# Patient Record
Sex: Female | Born: 1955 | Hispanic: No | Marital: Married | Smoking: Current every day smoker
Health system: Southern US, Community
[De-identification: ages and names within clinical notes are randomized; demographics above are authoritative.]

## PROBLEM LIST (undated history)

## (undated) DIAGNOSIS — M1990 Unspecified osteoarthritis, unspecified site: Secondary | ICD-10-CM

## (undated) DIAGNOSIS — K219 Gastro-esophageal reflux disease without esophagitis: Secondary | ICD-10-CM

## (undated) HISTORY — PX: TUBAL LIGATION: SHX77

## (undated) HISTORY — PX: ABDOMINAL HYSTERECTOMY: SHX81

## (undated) HISTORY — PX: PARATHYROIDECTOMY: SHX19

## (undated) HISTORY — PX: CHOLECYSTECTOMY: SHX55

---

## 2010-02-25 ENCOUNTER — Ambulatory Visit: Payer: Self-pay | Admitting: Gastroenterology

## 2010-02-25 ENCOUNTER — Encounter (INDEPENDENT_AMBULATORY_CARE_PROVIDER_SITE_OTHER): Payer: Self-pay | Admitting: *Deleted

## 2010-02-25 DIAGNOSIS — J4489 Other specified chronic obstructive pulmonary disease: Secondary | ICD-10-CM | POA: Insufficient documentation

## 2010-02-25 DIAGNOSIS — R1013 Epigastric pain: Secondary | ICD-10-CM | POA: Insufficient documentation

## 2010-02-25 DIAGNOSIS — K219 Gastro-esophageal reflux disease without esophagitis: Secondary | ICD-10-CM | POA: Insufficient documentation

## 2010-02-25 DIAGNOSIS — J449 Chronic obstructive pulmonary disease, unspecified: Secondary | ICD-10-CM

## 2010-02-25 DIAGNOSIS — D509 Iron deficiency anemia, unspecified: Secondary | ICD-10-CM | POA: Insufficient documentation

## 2010-02-25 DIAGNOSIS — K625 Hemorrhage of anus and rectum: Secondary | ICD-10-CM

## 2010-02-25 LAB — CONVERTED CEMR LAB
AST: 19 units/L (ref 0–37)
Alkaline Phosphatase: 69 units/L (ref 39–117)
Basophils Absolute: 0.1 10*3/uL (ref 0.0–0.1)
Basophils Relative: 0.8 % (ref 0.0–3.0)
Bilirubin, Direct: 0.1 mg/dL (ref 0.0–0.3)
Calcium: 10.3 mg/dL (ref 8.4–10.5)
Chloride: 110 meq/L (ref 96–112)
Eosinophils Absolute: 0 10*3/uL (ref 0.0–0.7)
Glucose, Bld: 87 mg/dL (ref 70–99)
Iron: 72 ug/dL (ref 42–145)
Lymphocytes Relative: 21.5 % (ref 12.0–46.0)
MCHC: 33.3 g/dL (ref 30.0–36.0)
MCV: 86.8 fL (ref 78.0–100.0)
Monocytes Absolute: 0.4 10*3/uL (ref 0.1–1.0)
Monocytes Relative: 4.5 % (ref 3.0–12.0)
Neutro Abs: 6.7 10*3/uL (ref 1.4–7.7)
Platelets: 200 10*3/uL (ref 150.0–400.0)
Potassium: 4.7 meq/L (ref 3.5–5.1)
TSH: 1.15 microintl units/mL (ref 0.35–5.50)
Total Bilirubin: 0.5 mg/dL (ref 0.3–1.2)
Total Protein: 6.3 g/dL (ref 6.0–8.3)
Vitamin B-12: 321 pg/mL (ref 211–911)

## 2010-03-03 LAB — CONVERTED CEMR LAB
Tissue Transglutaminase Ab, IgA: 4.7 units (ref <20)
Vit D, 25-Hydroxy: 15 ng/mL — ABNORMAL LOW (ref 30–89)

## 2010-03-14 ENCOUNTER — Ambulatory Visit: Payer: Self-pay | Admitting: Gastroenterology

## 2010-03-14 LAB — CONVERTED CEMR LAB: UREASE: NEGATIVE

## 2010-03-18 ENCOUNTER — Encounter: Payer: Self-pay | Admitting: Gastroenterology

## 2010-03-28 ENCOUNTER — Ambulatory Visit: Payer: Self-pay | Admitting: Gastroenterology

## 2010-03-28 DIAGNOSIS — K5900 Constipation, unspecified: Secondary | ICD-10-CM | POA: Insufficient documentation

## 2010-04-14 ENCOUNTER — Telehealth: Payer: Self-pay | Admitting: Gastroenterology

## 2010-04-25 ENCOUNTER — Ambulatory Visit: Payer: Self-pay | Admitting: Gastroenterology

## 2010-04-29 ENCOUNTER — Ambulatory Visit (HOSPITAL_COMMUNITY): Admission: RE | Admit: 2010-04-29 | Discharge: 2010-04-29 | Payer: Self-pay | Admitting: Gastroenterology

## 2010-05-30 ENCOUNTER — Encounter: Payer: Self-pay | Admitting: Gastroenterology

## 2010-06-12 ENCOUNTER — Telehealth: Payer: Self-pay | Admitting: Gastroenterology

## 2010-06-18 ENCOUNTER — Telehealth: Payer: Self-pay | Admitting: Gastroenterology

## 2010-06-23 ENCOUNTER — Telehealth: Payer: Self-pay | Admitting: Gastroenterology

## 2010-11-25 NOTE — Procedures (Signed)
Summary: Colonoscopy  Patient: Lori Cabrera Note: All result statuses are Final unless otherwise noted.  Tests: (1) Colonoscopy (COL)   COL Colonoscopy           DONE     [facility]     [address]     [place], [place]  [address]           COLONOSCOPY PROCEDURE REPORT           PATIENT:  [name], [name]  MR#:  [id number]     BIRTHDATE:  April 09, 1956, 53 yrs. old  GENDER:  female     ENDOSCOPIST:  Vania Rea. Jarold Motto, MD, [facility]     REF. BY:     PROCEDURE DATE:  03/14/2010     PROCEDURE:  Colonoscopy with biopsy     ASA CLASS:  Class II     INDICATIONS:  Routine Risk Screening, hematochezia     MEDICATIONS:   Fentanyl 50 mcg IV, Versed 7 mg IV           DESCRIPTION OF PROCEDURE:   After the risks benefits and     alternatives of the procedure were thoroughly explained, informed     consent was obtained.  Digital rectal exam was performed and     revealed no abnormalities.   The LB CF-H180AL [id number] endoscope     was introduced through the anus and advanced to the cecum, which     was identified by both the appendix and ileocecal valve, without     limitations.  The quality of the prep was excellent, using     MoviPrep.  The instrument was then slowly withdrawn as the colon     was fully examined.     <<PROCEDUREIMAGES>>           FINDINGS:  No polyps or cancers were seen.  This was otherwise a     normal examination of the colon. random biopsies done.     Retroflexed views in the rectum revealed internal hemorrhoids.     The scope was then withdrawn from the patient and the procedure     completed.           COMPLICATIONS:  None     ENDOSCOPIC IMPRESSION:     1) No polyps or cancers     2) Otherwise normal examination     3) Internal hemorrhoids     RECOMMENDATIONS:     1) high fiber diet     2) Upper endoscopy will be scheduled     daily miralax,,,,     REPEAT EXAM:  No           ______________________________     Vania Rea. Jarold Motto, MD, [facility]       CC:  Joette Catching, MD           n.     Rosalie Doctor:   Vania Rea. Leafy Motsinger at 03/14/2010 02:37 PM           [place], [name], [id number]  Note: An exclamation mark (!) indicates a result that was not dispersed into the flowsheet. Document Creation Date: 03/14/2010 2:38 PM _______________________________________________________________________  (1) Order result status: Final Collection or observation date-time: 03/14/2010 14:29 Requested date-time:  Receipt date-time:  Reported date-time:  Referring Physician:   Ordering Physician: Sheryn Bison [phone]) Specimen Source:  Source: Launa Grill Order Number: [id number] Lab site:

## 2010-11-25 NOTE — Letter (Signed)
Summary: Osmoprep Instructions  [facility]  [address] [place], [place] [address]   Phone: [phone]  Fax: [phone]       Lori Cabrera    Mar 19, 1956    MRN: [id number]        Procedure Day /Date: Friday, 03/14/10     Arrival Time: 1:00      Procedure Time: 2:00     Location of Procedure:                    [facility] (4th Floor)     PREPARATION FOR COLONOSCOPY WITH OSMOPREP  Starting 5 days prior to your procedure 03/09/10 do not eat nuts, seeds, popcorn, corn, beans, peas,  salads, or any raw vegetables.  Do not take any fiber supplements (e.g. Metamucil, Citrucel, and Benefiber). _________________________________________________________________________________________________  THE DAY BEFORE YOUR PROCEDURE             DATE: 03/13/10     DAY: Thursday  1.   Drink clear liquids the entire day - NO SOLID FOOD.  Drink at least 64 oz. of fluid during the day to prevent hydration and help the prep work efficiently.    2.   Do not drink anything colored red or purple.  Avoid juices with pulp.  No orange juice.              CLEAR LIQUIDS INCLUDE: Water Jello Ice Popsicles Tea (sugar ok, no milk/cream) Powdered fruit flavored drinks Coffee (sugar ok, no milk/cream) Gatorade Juice: apple, white grape, white cranberry  Lemonade Clear bullion, consomm, broth Carbonated beverages (any kind) Strained chicken noodle soup Hard Candy   3.   Beginning at 5:00 p.m. or 6:00 p.m. the night before your procedure, drink one dose (4 tablets with 8 oz. of any clear liquid) every 15 minutes for a total of 5 doses (20 tablets total).  ___________________________________________________________________________________________________   THE DAY OF YOUR PROCEDURE            DATE: 03/14/10   DAY: Friday  1.   Beginning at 9:00  (5 hours before procedure), drink one dose (4 tablets with 8 oz. of any clear liquid) every 15 minutes for a total of  3 doses (12 tablets).  2.   You may drink clear liquids until 12:00 (2 hours before exam).  Do not drink anything after this time.       MEDICATION INSTRUCTIONS  Unless otherwise instructed, you should take regular prescription medications with a small sip of water as early as possible the morning of your procedure.                   OTHER INSTRUCTIONS  You will need a responsible adult at least 54 years of age to accompany you and drive you home.   This person must remain in the waiting room during your procedure.  Wear loose fitting clothing that is easily removed.  Leave jewelry and other valuables at home.  However, you may wish to bring a book to read or an iPod/MP3 player to listen to music as you wait for your procedure to start.  Remove all body piercing jewelry and leave at home.  Total time from sign-in until discharge is approximately 2-3 hours.  You should go home directly after your procedure and rest.  You can resume normal activities the day after your procedure.  The day of your procedure you should not:   Drive  Make legal decisions   Operate machinery   Drink alcohol   Return to work  You will receive specific instructions about eating, activities and medications before you leave.   The above instructions have been reviewed and explained to me by   _______________________   I fully understand and can verbalize these instructions _____________________________ Date _________

## 2010-11-25 NOTE — Letter (Signed)
Summary: OSP Based Product Consent Form  [facility]  [address] [place], [place] [address]   Phone: [phone]  Fax: [phone]      Feb 25, 2010 MRN: [id number] Lori Cabrera  [facility]  OSP BASED PRODUCT CONSENT FORM  I understand that in order to prepare myself for an examination of my large bowel during a colonoscpoy I must undergo a bowel cleansing prior to the procedure. This is to ensure that my bowel is as clean as possible so the physician performing the procedure can thoroughly examine the lining of my colon to detect polyps or other abnormalities.  I have beeen informed that the FDA has become aware of reports of acute phosphate nephropathy, a type of acute kidney injury, associated with the use of oral sodium phosphate based products (OPS) for bowel cleansing prior to colonoscopy and other procedures. These rare but serious events have occurred in some patients wothout identifible factors that would put them at risk for developing acute kidney injury. These products include the prescription products, OsmoPrep and Visicol, as well as previously available over-the-counter laxatives (e.g., Fleet Phospho-soda).  Prior caution should be taken in patients with a history of renal insufficiency, advanced age, or those taking diuretics or certain blood pressure medications. The use of OSP is contraindicated in patients with advanced renal disease or a history of heart failure.  Alternative preparations for bowel cleansing recommended by my physician include: MoviPrep, Golytely, HalfLytely, Miralax, or other PEG (polyethylene glycol) based products which, to date, have not been associated with these types od adverse events.  The above risks and alternatives have been fully expained to me and I am aware that by taking OsmoPrep, or like products, an acute kidney injury may occur without warning. Despite this warning, I have chosen to use a phosphate-based  regime for bowel cleansing prior to my colonoscopy and will assume responsibility for any adverse effect that I may experience from this product. I also understand the importance of vigorous hydration before and immediately following my procedure.     Instructed by:________________________________     Signed:________________________________________   Date:_______________   Original 12/27/07

## 2010-11-25 NOTE — Letter (Signed)
Summary: Patient [facility] Biopsy Results  [facility]  [address] [place], [place] [address]   Phone: [phone]  Fax: [phone]        Mar 18, 2010 MRN: [id number]    [facility] [address] [place], [place]  [address]    Dear Ms. Symons,  I am pleased to inform you that the biopsies taken during your recent endoscopic examination did not show any evidence of cancer upon pathologic examination.Biopsies showed no evidence of celiac disease, H. pylori infection, or dysplasia.  Additional information/recommendations:  __No further action is needed at this time.  Please follow-up with      your primary care physician for your other healthcare needs.  _x_ Please call [phone] to schedule a return visit to review      your condition.  _x_ Continue with the treatment plan as outlined on the day of your      exam.  __ You should have a repeat endoscopic examination for this problem              in _ months/years.   Please call us if you are having persistent problems or have questions about your condition that have not been fully answered at this time.  Sincerely,  Mardella Layman MD [facility]  This letter has been electronically signed by your physician.  Appended Document: Patient Notice-Endo Biopsy Results letter mailed.

## 2010-11-25 NOTE — Procedures (Signed)
Summary: Upper Endoscopy  Patient: Lori Cabrera Note: All result statuses are Final unless otherwise noted.  Tests: (1) Upper Endoscopy (EGD)   EGD Upper Endoscopy       DONE     [facility]     [address]     [place], [place]  [address]           ENDOSCOPY PROCEDURE REPORT           PATIENT:  [name], Lori  MR#:  [id number]     BIRTHDATE:  1956-04-25, 53 yrs. old  GENDER:  female           ENDOSCOPIST:  Vania Rea. Jarold Motto, MD, [facility]     Referred by:           PROCEDURE DATE:  03/14/2010     PROCEDURE:  EGD with biopsy     ASA CLASS:  Class II     INDICATIONS:  dyspepsia, abdominal pain despite treatment           MEDICATIONS:   There was residual sedation effect present from     prior procedure., Fentanyl 25 mcg IV, Versed 2 mg IV     TOPICAL ANESTHETIC:           DESCRIPTION OF PROCEDURE:   After the risks benefits and     alternatives of the procedure were thoroughly explained, informed     consent was obtained.  The LB GIF-H180 [id number] endoscope was     introduced through the mouth and advanced to the second portion of     the duodenum, without limitations.  The instrument was slowly     withdrawn as the mucosa was fully examined.     <<PROCEDUREIMAGES>>           Duodenitis was found. inflammatory polyps noted.  Severe gastritis     was found. nodularity and erythema biopsied in body and antrum     areas.SEE PICTURES.    Retroflexed views revealed no masses.     The scope was then withdrawn from the patient and the procedure     completed.           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     RECOMMENDATIONS:     1) Await biopsy results     2) Rx CLO if positive     3) continue current medications           REPEAT EXAM:  No           ______________________________     Vania Rea. Jarold Motto, MD, [facility]           CC:   Karie Schwalbe, MD           n.     Rosalie DoctorMarland Kitchen   Vania Rea. Patterson at 03/14/2010 02:50 PM           [place], [place], [id number]  Note: An exclamation mark (!) indicates a result that was not dispersed into the flowsheet. Document Creation Date: 03/14/2010 2:51 PM _______________________________________________________________________  (1) Order result status: Final Collection or observation date-time: 03/14/2010 14:45 Requested date-time:  Receipt date-time:  Reported date-time:  Referring Physician:   Ordering Physician: Sheryn Bison [phone]) Specimen Source:  Source: Launa Grill Order Number: [id number]  Lab site:

## 2010-11-25 NOTE — Letter (Signed)
Summary: Patient Notice- Colon Biospy Results  [facility]  [address] [place], [place] [address]   Phone: [phone]  Fax: [phone]        Mar 18, 2010 MRN: [id number]    [facility] [address] [place], [place]  [address]    Dear Ms. Fiola,  I am pleased to inform you that the biopsies taken during your recent colonoscopy did not show any evidence of cancer upon pathologic examination.  Additional information/recommendations:  __No further action is needed at this time.  Please follow-up with      your primary care physician for your other healthcare needs.  x__Please call [phone] to schedule a return visit to review      your condition.  _x_Continue with the treatment plan as outlined on the day of your      exam.  __You should have a repeat colonoscopy examination for this problem           in _ years.  Please call us if you are having persistent problems or have questions about your condition that have not been fully answered at this time.  Sincerely,  Mardella Layman MD [facility]   This letter has been electronically signed by your physician.  Appended Document: Patient Notice- Colon Biospy Results letter mailed.

## 2010-11-25 NOTE — Miscellaneous (Signed)
Summary: Clotest  Clinical Lists Changes  Orders: Added new Test order of TLB-H Pylori Screen Gastric Biopsy (83013-CLOTEST) - Signed 

## 2010-11-25 NOTE — Assessment & Plan Note (Signed)
Summary: Post Proc/dfs   History of Present Illness Visit Type: Follow-up Visit Primary GI MD: Sheryn Bison MD FACP FAGA Primary Provider: Joette Catching, MD Requesting Provider: na Chief Complaint: F/u for colon and endo. Pt states that she is doing better but because of iron she is having constipation problems  History of Present Illness:   Continued severe constipation and exacerbated by oral iron therapy. Associated with her constipation is gas, bloating, and vague abdominal discomfort mostly in the right upper quadrant area. She is status post cholecystectomy.  She recently underwent endoscopy and colonoscopy for evaluation of her constipation and rectal bleeding. Exam is unremarkable except for hemorrhoidal problems. She is taking MiraLax at nighttime without improvement. She was found to have iron and vitamin D deficiency and is on replacement therapy. Her constipation is most consistent with possible sigmoid colon dysfunction. She has had no nausea vomiting, systemic complaints, other neurologic or urologic symptomatology.   GI Review of Systems      Denies abdominal pain, acid reflux, belching, bloating, chest pain, dysphagia with liquids, dysphagia with solids, heartburn, loss of appetite, nausea, vomiting, vomiting blood, weight loss, and  weight gain.      Reports constipation.     Denies anal fissure, black tarry stools, change in bowel habit, diarrhea, diverticulosis, fecal incontinence, heme positive stool, hemorrhoids, irritable bowel syndrome, jaundice, light color stool, liver problems, rectal bleeding, and  rectal pain.    Current Medications (verified): 1)  Aciphex 20 Mg Tbec (Rabeprazole Sodium) .... One Tablet By Mouth Once Daily 2)  Integra Plus  Caps (Fefum-Fepoly-Fa-B Cmp-C-Biot) .... Take 1 Capsule By Mouth Two Times A Day 3)  Analpram-Hc 1-2.5 %  Crea (Hydrocortisone Ace-Pramoxine) .... Use 3-4 X Daily As Needed 4)  Miralax   Powd (Polyethylene Glycol 3350)  .... Two Times A Day 5)  Caltrate 600+d 600-400 Mg-Unit Tabs (Calcium Carbonate-Vitamin D) .... Two Times A Day  Allergies (verified): 1)  Codeine  Past History:  Past medical, surgical, family and social histories (including risk factors) reviewed for relevance to current acute and chronic problems.  Past Medical History: Reviewed history from 02/25/2010 and no changes required. Anemia gastritis Gallstones Colon Polyps  Past Surgical History: Reviewed history from 02/25/2010 and no changes required. Cholecystectomy Tubal Ligation  Family History: Reviewed history from 02/25/2010 and no changes required. Family History of Breast Cancer: Several maternal aunts, great aunts Family History of Diabetes: Mother, Paternal aunts, uncles Family History of Heart Disease: Father-CHF, MGF-MI No FH of Colon Cancer:  Social History: Reviewed history from 02/25/2010 and no changes required. Married, 3 boys, 1 girl Patient currently smokes.  Daily Caffeine Use 10/day Illicit Drug Use - no  Review of Systems  The patient denies allergy/sinus, anemia, anxiety-new, arthritis/joint pain, back pain, blood in urine, breast changes/lumps, change in vision, confusion, cough, coughing up blood, depression-new, fainting, fatigue, fever, headaches-new, hearing problems, heart murmur, heart rhythm changes, itching, menstrual pain, muscle pains/cramps, night sweats, nosebleeds, pregnancy symptoms, shortness of breath, skin rash, sleeping problems, sore throat, swelling of feet/legs, swollen lymph glands, thirst - excessive , urination - excessive , urination changes/pain, urine leakage, vision changes, and voice change.    Vital Signs:  Patient profile:   55 year old female Height:      65 inches Weight:      143 pounds BMI:     23.88 BSA:     1.72 Pulse rate:   76 / minute Pulse rhythm:   regular BP sitting:   108 /  60  (left arm) Cuff size:   regular  Vitals Entered By: Ok Anis CMA  (March 28, 2010 11:16 AM)  Physical Exam  General:  Well developed, well nourished, no acute distress.healthy appearing.  healthy appearing.   Head:  Normocephalic and atraumatic. Eyes:  PERRLA, no icterus. Abdomen:  No abdominal distention noted, organomegaly, masses or tenderness. Bowel sounds are somewhat hypoactive in nature. Psych:  Alert and cooperative. Normal mood and affect.   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 I have advanced her MiraLax 8 ounces twice a day, add Colace 100 mg twice a day, stopped iron, will try Amitiza 8 micrograms twice a day with liberal p.o. fluids, and also add [employer] father-probiotic tablets twice a day with office followup in 2-3 weeks' time. She may well need Sitz marker study exam.  Problem # 2:  ABDOMINAL PAIN, EPIGASTRIC (ICD-789.06) Assessment: Improved Continue AcipHex 20 mg a day.  Problem # 3:  HEMORRHAGE OF RECTUM AND ANUS (ICD-569.3) Assessment: Improved Hemorrhoidal bleeding has ceased. See colonoscopy report for details  Patient Instructions: 1)  Begin Amitiza 8 mg two times a day. 2)  Begin Colace two times a day.   3)  Begin Vear Clock Colon health two times a day.  Buy this OTC   4)  Increase Miralax to two times a day. 5)  Stop Iron. 6)  Please schedule a follow-up appointment in 3 weeks.  7)  The medication list was reviewed and reconciled.  All changed / newly prescribed medications were explained.  A complete medication list was provided to the patient / caregiver. 8)  Copy sent to : Dr. Joette Catching 9)  Please continue current medications.  10)  Constipation and Hemorrhoids brochure given.   Appended Document: Post Proc/dfs    Clinical Lists Changes  Medications: Added new medication of AMITIZA 8 MCG  CAPS (LUBIPROSTONE) 1 two times a day/take with food and water - Signed Added  new medication of COLACE 100 MG  CAPS (DOCUSATE SODIUM) 1 by mouth two times a day - Signed Added new medication of PHILLIPS COLON HEALTH  CAPS (PROBIOTIC PRODUCT) two times a day Rx of AMITIZA 8 MCG  CAPS (LUBIPROSTONE) 1 two times a day/take with food and water;  #60 x 11;  Signed;  Entered by: Ashok Cordia RN;  Authorized by: Mardella Layman MD [facility];  Method used: [facility] [phone]*, [address], [place], [place], [place]  [address], Ph: [phone] or [phone], Fax: [phone] Rx of COLACE 100 MG  CAPS (DOCUSATE SODIUM) 1 by mouth two times a day;  #60 x 11;  Signed;  Entered by: Ashok Cordia RN;  Authorized by: Mardella Layman MD [facility];  Method used: [facility] to [facility] [phone]*, [address] [place], [place], [place]  [address], Ph: [phone] or [phone], Fax: [phone]    Prescriptions: COLACE 100 MG  CAPS (DOCUSATE SODIUM) 1 by mouth two times a day  #60 x 11   Entered by:   Ashok Cordia RN   Authorized by:   Mardella Layman MD [facility]   Signed by:   Ashok Cordia RN on 03/28/2010   Method used:   [facility]ronically to        [facility] [phone]* (retail)       [address]  [place], [place]  [address]       Ph: [phone] or [phone]       Fax: [phone]   RxID:   [id number] AMITIZA 8 MCG  CAPS (LUBIPROSTONE) 1 two times a day/take with food and water  #60 x 11   Entered by:   Ashok Cordia RN   Authorized by:   Mardella Layman MD [facility]   Signed by:   Ashok Cordia RN on 03/28/2010   Method used:   Electronically to        [facility] [phone]* (retail)       [address]       [place], [place]  [address]       Ph: [phone] or [phone]       Fax: [phone]   RxID:   [id number]

## 2010-11-25 NOTE — Progress Notes (Signed)
Summary: results request  Phone Note Call from Patient Call back at Home Phone [phone]   Caller: Patient Call For: Dr. Jarold Motto Reason for Call: Lab or Test Results Summary of Call: would like manometry results Initial call taken by: Vallarie Mare,  June 23, 2010 12:50 PM  Follow-up for Phone Call        PEr Dr. Jarold Motto.  Pt has weak anal sphincter preessure.  Recommendation from [facility] is Biofeedback for pelvic floor retraining. Follow-up by: Ashok Cordia RN,  June 23, 2010 4:42 PM  Additional Follow-up for Phone Call Additional follow up Details #1::        Pt notified.  States she is not interested in biofeedback at this time.  She will call back if she changes her mind. Additional Follow-up by: Ashok Cordia RN,  June 25, 2010 10:05 AM

## 2010-11-25 NOTE — Progress Notes (Signed)
Summary: constipation  Phone Note Call from Patient Call back at [facility] Phone [phone]   Caller: Patient Call For: Dr. Jarold Motto Reason for Call: Talk to Nurse Summary of Call: still constipated and meds not helping... crying from pain when trying to have a BM and blood still coming out when trying to have a BM Initial call taken by: Vallarie Mare,  April 14, 2010 4:50 PM  Follow-up for Phone Call        See last OV note.  Pt states she is using the amitiza, colace and miralax. States she is onlu passing very small amt of stool.  Passing bright red blood in stool.  C/O cramping abd pain.  Asking what else can she do? Follow-up by: Ashok Cordia RN,  April 14, 2010 4:58 PM    Additional Follow-up for Phone Call Additional follow up Details #2::    dulcolax tabs ..2 .two times a day....analmantle plus kit...continue other meds..Start over the [employer], which is effective in treating constipation and can be taken chronically.  Start with one scoop a day, titrating up or down as needed. two times a day....also daily benefiber... Follow-up by: Mardella Layman MD FACG,  April 15, 2010 8:28 AM  Additional Follow-up for Phone Call Additional follow up Details #3:: Details for Additional Follow-up Action Taken: Pt notified.   Additional Follow-up by: Ashok Cordia RN,  April 15, 2010 8:55 AM  New/Updated Medications: ANAMANTLE HC 3-0.5 %  KIT (LIDOCAINE-HYDROCORTISONE ACE) Use two times a day. Prescriptions: ANAMANTLE HC 3-0.5 %  KIT (LIDOCAINE-HYDROCORTISONE ACE) Use two times a day.  #30 x 3   Entered by:   Ashok Cordia RN   Authorized by:   Mardella Layman MD [facility]   Signed by:   Ashok Cordia RN on 04/15/2010   Method used:   [facility]        [facility] [phone]* (retail)       [address]       [place], [place]  [address]       Ph: [phone] or [phone]       Fax: [phone]   RxID:   [id number]

## 2010-11-25 NOTE — Assessment & Plan Note (Signed)
Summary: RECTAL BLEEDING...AS.   History of Present Illness Visit Type: consult Primary GI MD: Sheryn Bison MD FACP FAGA Requesting Provider: Joette Catching, MD Chief Complaint: rectal bleeding x 2 years, pt was seen at [facility] 2 years ago.  Blood is in toilet History of Present Illness:   55 year old Caucasian female heavy smoker referred by Dr. Lysbeth Galas for a 45 year history of intermittent rectal bleeding with worsening constipation. Apparently she was hospitalized in [facility] 3 years ago with rectal bleeding, and had a negative colonoscopy and endoscopy. These records are not available for review.  She continues with chronic dyspepsia and a" weak stomach" with intolerance to fried greasy foods. She drinks volumes of Pepsi Cola daily but apparently otherwise no fluids, and very little fiber. She may go up to 2 weeks allegedly without a bowel movement .She has diarrhea with laxative use. Her rectal bleeding is bright red blood and her hemoglobin recently was 11.5 with a 8% saturation. She denies a history of osteoporosis, mental status problems, recurrent skin rashes. She denies lactose intolerance or ingestion of sorbitol or fructose. She had cholecystectomy some 25 years ago for cholelithiasis. She recently was placed on AcipHex 20 mg a day with mild improvement in her dyspepsia, also oral iron therapy. She has reflux symptoms but denies dysphagia.  Her past sutures consistent with NSAID abuse, and she apparently avoid salicylates and NSAIDs currently. She continues to smoke one half packs of cigarettes per day denies ethanol abuse. She denies a past history of hepatic disease, pancreatitis, or alcohol related illnesses. Family history is noncontributory.   GI Review of Systems    Reports acid reflux and  heartburn.      Denies abdominal pain, belching, bloating, chest pain, dysphagia with liquids, dysphagia with solids, loss of appetite, nausea, vomiting, vomiting blood,  weight loss, and  weight gain.      Reports black tarry stools, constipation, and  rectal bleeding.     Denies anal fissure, change in bowel habit, diarrhea, diverticulosis, fecal incontinence, heme positive stool, hemorrhoids, irritable bowel syndrome, jaundice, light color stool, liver problems, and  rectal pain. Preventive Screening-Counseling & Management      Drug Use:  no.      Current Medications (verified): 1)  Aciphex 20 Mg Tbec (Rabeprazole Sodium) .Marland Kitchen.. 1 Qd 2)  Integra Plus  Caps (Fefum-Fepoly-Fa-B Cmp-C-Biot) .... Take 1 Capsule By Mouth Two Times A Day  Allergies (verified): 1)  Codeine  Past History:  Past medical, surgical, family and social histories (including risk factors) reviewed for relevance to current acute and chronic problems.  Past Medical History: Anemia gastritis Gallstones Colon Polyps  Past Surgical History: Cholecystectomy Tubal Ligation  Family History: Reviewed history and no changes required. Family History of Breast Cancer: Several maternal aunts, great aunts Family History of Diabetes: Mother, Paternal aunts, uncles Family History of Heart Disease: Father-CHF, MGF-MI  Social History: Reviewed history from 02/24/2010 and no changes required. Married, 3 boys, 1 girl Patient currently smokes.  Daily Caffeine Use 10/day Illicit Drug Use - no Drug Use:  no  Review of Systems       The patient complains of allergy/sinus, anxiety-new, cough, menstrual pain, night sweats, thirst - excessive, and vision changes.  The patient denies anemia, arthritis/joint pain, back pain, blood in urine, breast changes/lumps, confusion, coughing up blood, depression-new, fainting, fatigue, fever, headaches-new, hearing problems, heart murmur, heart rhythm changes, itching, muscle pains/cramps, nosebleeds, pregnancy symptoms, shortness of breath, skin rash, sleeping problems, sore throat,  swelling of feet/legs, swollen lymph glands, urination - excessive,  urination changes/pain, urine leakage, and voice change.   General:  Complains of sweats; denies fever, chills, anorexia, fatigue, weakness, malaise, weight loss, and sleep disorder. Eyes:  Complains of vision loss; denies blurring, diplopia, irritation, discharge, scotoma, eye pain, and photophobia. ENT:  Denies earache, ear discharge, tinnitus, decreased hearing, nasal congestion, loss of smell, nosebleeds, sore throat, hoarseness, and difficulty swallowing. CV:  Complains of dyspnea on exertion; denies chest pains, angina, palpitations, syncope, orthopnea, PND, peripheral edema, and claudication. Resp:  Complains of dyspnea with exercise and cough; denies dyspnea at rest, sputum, wheezing, coughing up blood, and pleurisy. GI:  Complains of indigestion/heartburn, abdominal pain, constipation, and bloody BM's; denies difficulty swallowing, pain on swallowing, nausea, vomiting, vomiting blood, jaundice, gas/bloating, diarrhea, change in bowel habits, black BMs, and fecal incontinence. GU:  Denies urinary burning, blood in urine, nocturnal urination, urinary frequency, urinary incontinence, abnormal vaginal bleeding, amenorrhea, menorrhagia, vaginal discharge, pelvic pain, genital sores, painful intercourse, and decreased libido. MS:  Denies joint pain / LOM, joint swelling, joint stiffness, joint deformity, low back pain, muscle weakness, muscle cramps, muscle atrophy, leg pain at night, leg pain with exertion, and shoulder pain / LOM hand / wrist pain (CTS). Derm:  Denies rash, itching, dry skin, hives, moles, warts, and unhealing ulcers. Neuro:  Complains of restless legs; denies weakness, paralysis, abnormal sensation, seizures, syncope, tremors, vertigo, transient blindness, frequent falls, frequent headaches, difficulty walking, headache, sciatica, radiculopathy other:, memory loss, and confusion. Endo:  Complains of polydipsia; denies cold intolerance, heat intolerance, polyphagia, polyuria, unusual  weight change, and hirsutism. Heme:  Denies bruising, bleeding, enlarged lymph nodes, and pagophagia. Allergy:  Complains of hay fever.  Vital Signs:  Patient profile:   55 year old female Height:      65 inches Weight:      147 pounds BMI:     24.55 Pulse rate:   64 / minute Pulse rhythm:   regular BP sitting:   120 / 70  (left arm) Cuff size:   regular  Vitals Entered By: Francee Piccolo CMA Duncan Dull) (Feb 25, 2010 8:39 AM)  Physical Exam  General:  Well developed, well nourished, no acute distress.Appearance consistent with COPD but no jaundice or stigmata of chronic liver disease. Head:  Normocephalic and atraumatic. Eyes:  PERRLA, no icterus.exam deferred to patient's ophthalmologist.   Neck:  Supple; no masses or thyromegaly. Lungs:  decreased BS on L, decreased BS on R, and rhonchi bilateral.   Heart:  Regular rate and rhythm; no murmurs, rubs,  or bruits. Abdomen:  Her Abdomen Is not distended or tender. Her liver edge is very firm in 2-3 cm below the right subcostal margin. I cannot appreciate any tenderness. There is no splenomegaly, other abdominal masses or tenderness.Bowel sounds are normal and I cannot appreciate an abdominal bruit. Rectal:  External hemorrhoids and skin tags noted without rectal masses or tenderness. I cannot appreciate any fissure or fistula. Her uterus is retroflexed and is palpable. There is soft stool present of normal color which is guaiac negative. Msk:  Symmetrical with no gross deformities. Normal posture. Pulses:  Normal pulses noted. Extremities:  No clubbing, cyanosis, edema or deformities noted. Neurologic:  Alert and  oriented x4;  grossly normal neurologically. Skin:  Intact without significant lesions or rashes. Cervical Nodes:  No significant cervical adenopathy. Psych:  Alert and cooperative. Normal mood and affect.   Impression & Recommendations:  Problem # 1:  ABDOMINAL PAIN, EPIGASTRIC (  ICD-789.06) Assessment  Improved Probable chronic gastroesophageal reflux disease currently doing much better on daily PPI therapy--rule out H. pylori infection. She is status post cholecystectomy. Abdominal exam does show mild hepatomegaly, liver function tests have been normal. She denies a previous history of alcohol abuse, IV drug use, previous hepatitis, or family history of liver disease. Screening labs have been ordered. She may need followup upper abdominal ultrasound exam. Endoscopy has been scheduled and we will continue PPI therapy. Also serologies have been ordered to exclude celiac disease. Orders: TLB-CBC Platelet - w/Differential (85025-CBCD) TLB-BMP (Basic Metabolic Panel-BMET) (80048-METABOL) TLB-Hepatic/Liver Function Pnl (80076-HEPATIC) TLB-TSH (Thyroid Stimulating Hormone) (84443-TSH) TLB-B12, Serum-Total ONLY (16109-U04) TLB-Ferritin (82728-FER) TLB-Folic Acid (Folate) (82746-FOL) TLB-IBC Pnl (Iron/FE;Transferrin) (83550-IBC) TLB-Magnesium (Mg) (83735-MG) TLB-Sedimentation Rate (ESR) (85652-ESR) TLB-IgA (Immunoglobulin A) (82784-IGA) T-Sprue Panel (Celiac Disease Aby Eval) (83516x3/[address]) T-Vitamin D (25-Hydroxy) (54098-11914) T- * Misc. Laboratory test [phone])  Problem # 2:  HEMORRHAGE OF RECTUM AND ANUS (ICD-569.3) Assessment: Unchanged Severe chronic constipation with hemorrhoidal bleeding-rule out metabolic causes of constipation, partial colonic obstruction, et Karie Soda. I placed her on nightly MiraLax 8 ounces as tolerated, liberal p.o. fluids, and outpatient colonoscopy exam with Osmolite prep at her request. She apparently could not tolerate an oral electrolyte preparation in the past. Records have been requested from [facility] for review. Labs also ordered as mentioned above. Orders: TLB-CBC Platelet - w/Differential (85025-CBCD) TLB-BMP (Basic Metabolic Panel-BMET) (80048-METABOL) TLB-Hepatic/Liver Function Pnl (80076-HEPATIC) TLB-TSH (Thyroid Stimulating Hormone)  (84443-TSH) TLB-B12, Serum-Total ONLY (62130-Q65) TLB-Ferritin (82728-FER) TLB-Folic Acid (Folate) (82746-FOL) TLB-IBC Pnl (Iron/FE;Transferrin) (83550-IBC) TLB-Magnesium (Mg) (83735-MG) TLB-Sedimentation Rate (ESR) (85652-ESR) TLB-IgA (Immunoglobulin A) (82784-IGA) T-Sprue Panel (Celiac Disease Aby Eval) (83516x3/[address]) T-Vitamin D (25-Hydroxy) (78469-62952) T- * Misc. Laboratory test [phone])  Problem # 3:  ANEMIA, IRON DEFICIENCY (ICD-280.9) Assessment: Unchanged Probable chronic loss from menorrhagia. We will continue oral iron therapy, check B12 and folate levels. Orders: TLB-CBC Platelet - w/Differential (85025-CBCD) TLB-BMP (Basic Metabolic Panel-BMET) (80048-METABOL) TLB-Hepatic/Liver Function Pnl (80076-HEPATIC) TLB-TSH (Thyroid Stimulating Hormone) (84443-TSH) TLB-B12, Serum-Total ONLY (44010-U72) TLB-Ferritin (82728-FER) TLB-Folic Acid (Folate) (82746-FOL) TLB-IBC Pnl (Iron/FE;Transferrin) (83550-IBC) TLB-Magnesium (Mg) (83735-MG) TLB-Sedimentation Rate (ESR) (85652-ESR) TLB-IgA (Immunoglobulin A) (82784-IGA) T-Sprue Panel (Celiac Disease Aby Eval) (83516x3/[address]) T-Vitamin D (25-Hydroxy) (53664-40347) T- * Misc. 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 If not performed, this patient does need chest x-ray evaluation. She has little in twisting quitting cigarette use. She denies any symptoms suggestive of coronary artery disease. Review of her lipid levels does not show any evidence of hyperlipidemia.  Patient Instructions: 1)  Please go to the basement for lab work. 2)  Use Miralax q hs. 3)  You are scheduled for an endoscopy and a colonoscopy. 4)  Please continue current medications.  5)  The medication list was reviewed and reconciled.  All changed / newly prescribed medications were explained.  A complete medication list was provided to the patient / caregiver. 6)  Copy  sent to : Dr. Joette Catching 7)  Please continue current medications.  8)  Constipation and Hemorrhoids brochure given.  9)  Colonoscopy and Flexible Sigmoidoscopy brochure given.  10)  Conscious Sedation brochure given.  11)  Upper Endoscopy brochure given.   Appended Document: RECTAL BLEEDING...AS.    Clinical Lists Changes  Medications: Added new medication of OSMOPREP 1.102-0.398 GM  TABS (SOD PHOS MONO-SOD PHOS DIBASIC) As per prep instructions. - Signed Added new medication of ANALPRAM-HC 1-2.5 %  CREA (HYDROCORTISONE ACE-PRAMOXINE) Use 3-4 x daily  as needed - Signed Added new medication of MIRALAX   POWD (POLYETHYLENE GLYCOL 3350) q hs Rx of OSMOPREP 1.102-0.398 GM  TABS (SOD PHOS MONO-SOD PHOS DIBASIC) As per prep instructions.;  #32 x 0;  Signed;  Entered by: Ashok Cordia RN;  Authorized by: Mardella Layman MD [facility];  Method used: [facility] to [facility] [phone]*, [address] [place], [place], [place]  [address], Ph: [phone] or [phone], Fax: [phone] Rx of ANALPRAM-HC 1-2.5 %  CREA (HYDROCORTISONE ACE-PRAMOXINE) Use 3-4 x daily as needed;  #30 gm x 2;  Signed;  Entered by: Ashok Cordia RN;  Authorized by: Mardella Layman MD [facility];  Method used: Electronically to [facility] [phone]*, [address] [place], [place], [place]  [address], Ph: [phone] or [phone], Fax: [phone] Orders: Added new Test order of Colon/Endo (Colon/Endo) - Signed    Prescriptions: ANALPRAM-HC 1-2.5 %  CREA (HYDROCORTISONE ACE-PRAMOXINE) Use 3-4 x daily as needed  #30 gm x 2   Entered by:   Ashok Cordia RN   Authorized by:   Mardella Layman MD [facility]   Signed by:   Ashok Cordia RN on 02/25/2010   Method used:   Electronically to        [facility] [phone]* (retail)       [address]       [place], [place]  [address]       Ph: [phone] or [phone]       Fax: [phone]   RxID:    [id number] OSMOPREP 1.102-0.398 GM  TABS (SOD PHOS MONO-SOD PHOS DIBASIC) As per prep instructions.  #32 x 0   Entered by:   Ashok Cordia RN   Authorized by:   Mardella Layman MD [facility]   Signed by:   Ashok Cordia RN on 02/25/2010   Method used:   Electronically to        [facility] [phone]* (retail)       [address]       [place], [place]  [address]       Ph: [phone] or [phone]       Fax: [phone]   RxID:   [id number]

## 2010-11-25 NOTE — Progress Notes (Signed)
Summary: Test Results  Phone Note Call from Patient Call back at Home Phone [phone]   Call For: Dr Jarold Motto Reason for Call: Lab or Test Results Summary of Call: Had a test at [facility] Initial call taken by: Leanor Kail [facility],  June 12, 2010 9:06 AM  Follow-up for Phone Call        Pt had anal manometry done at [facility] on May 30, 2010.  She had not heard anything from the results.  Pt informed that the results have not been sent to [place] yet.  It may take several weeks to hear from this test.   Follow-up by: Ashok Cordia RN,  June 12, 2010 12:15 PM

## 2010-11-25 NOTE — Procedures (Signed)
Summary: Anorectal Manometry / [facility]  Anorectal Manometry / [facility]   Imported By: Lennie Odor 07/01/2010 15:05:08  _____________________________________________________________________  External Attachment:    Type:   Image     Comment:   External Document

## 2010-11-25 NOTE — Assessment & Plan Note (Signed)
Summary: 3-4 WEEK F/U   History of Present Illness Visit Type: Follow-up Visit Primary GI MD: Sheryn Bison MD FACP FAGA Primary Provider: Joette Catching, MD Requesting Provider: na Chief Complaint: F/u for constipation.  Pt c/o straining with BMs  History of Present Illness:   Continued severe constipation with rectal outlet dysfunction. The patient has tried Amitiza 8 micrograms twice a day, MiraLax twice a day, probiotics, and fiber supplements without improvement. She continues to have rectal outlet dysfunction problems with intermittent rectal bleeding. If she takes a laxative, she has nausea and vomiting. She has had 5 children and perhaps pudendal nerve damage. Recent endoscopy, colonoscopy, and multiple screening labs were all unremarkable. I have discontinued her and calcium tablets. She does drink liberal p.o. fluids.There has been no evidence of metabolic disorder to explain her constipation.   GI Review of Systems      Denies abdominal pain, acid reflux, belching, bloating, chest pain, dysphagia with liquids, dysphagia with solids, heartburn, loss of appetite, nausea, vomiting, vomiting blood, weight loss, and  weight gain.      Reports constipation.     Denies anal fissure, black tarry stools, change in bowel habit, diarrhea, diverticulosis, fecal incontinence, heme positive stool, hemorrhoids, irritable bowel syndrome, jaundice, light color stool, liver problems, rectal bleeding, and  rectal pain.    Current Medications (verified): 1)  Aciphex 20 Mg Tbec (Rabeprazole Sodium) .... One Tablet By Mouth Once Daily 2)  Integra Plus  Caps (Fefum-Fepoly-Fa-B Cmp-C-Biot) .... Take 1 Capsule By Mouth Two Times A Day 3)  Analpram-Hc 1-2.5 %  Crea (Hydrocortisone Ace-Pramoxine) .... Use 3-4 X Daily As Needed 4)  Miralax   Powd (Polyethylene Glycol 3350) .... Two Times A Day 5)  Caltrate 600+d 600-400 Mg-Unit Tabs (Calcium Carbonate-Vitamin D) .... Two Times A Day 6)  Amitiza 8 Mcg   Caps (Lubiprostone) .Marland Kitchen.. 1 Two Times A Day/take With Food and Water 7)  Colace 100 Mg  Caps (Docusate Sodium) .Marland Kitchen.. 1 By Mouth Two Times A Day 8)  Phillips Colon Health  Caps (Probiotic Product) .... Two Times A Day 9)  Anamantle Hc 3-0.5 %  Kit (Lidocaine-Hydrocortisone Ace) .... Use Two Times A Day.  Allergies (verified): 1)  Codeine  Past History:  Past medical, surgical, family and social histories (including risk factors) reviewed for relevance to current acute and chronic problems.  Past Medical History: Reviewed history from 02/25/2010 and no changes required. Anemia gastritis Gallstones Colon Polyps  Past Surgical History: Reviewed history from 02/25/2010 and no changes required. Cholecystectomy Tubal Ligation  Family History: Reviewed history from 03/28/2010 and no changes required. Family History of Breast Cancer: Several maternal aunts, great aunts Family History of Diabetes: Mother, Paternal aunts, uncles Family History of Heart Disease: Father-CHF, MGF-MI No FH of Colon Cancer:  Social History: Reviewed history from 02/25/2010 and no changes required. Married, 3 boys, 1 girl Patient currently smokes.  Daily Caffeine Use 10/day Illicit Drug Use - no  Review of Systems       The patient complains of back pain.  The patient denies allergy/sinus, anemia, anxiety-new, arthritis/joint pain, blood in urine, breast changes/lumps, change in vision, confusion, cough, coughing up blood, depression-new, fainting, fatigue, fever, headaches-new, hearing problems, heart murmur, heart rhythm changes, itching, menstrual pain, muscle pains/cramps, night sweats, nosebleeds, pregnancy symptoms, shortness of breath, skin rash, sleeping problems, sore throat, swelling of feet/legs, swollen lymph glands, thirst - excessive , urination - excessive , urination changes/pain, urine leakage, vision changes, and voice change.  Vital Signs:  Patient profile:   55 year old female Height:       65 inches Weight:      149 pounds BMI:     24.88 BSA:     1.75 Pulse rate:   76 / minute Pulse rhythm:   regular BP sitting:   124 / 62  (left arm) Cuff size:   regular  Vitals Entered By: Ok Anis CMA (April 25, 2010 10:32 AM)  Physical Exam  General:  Well developed, well nourished, no acute distress.healthy appearing.  healthy appearing.   Head:  Normocephalic and atraumatic. Eyes:  PERRLA, no icterus. Ears:  normal auditory acuity.  normal auditory acuity.   Abdomen:   No abdominal distention, masses, or tenderness noted. Bowel sounds are normal. Rectal exam is deferred. Psych:  Alert and cooperative. Normal mood and affect.   Impression & Recommendations:  Problem # 1:  CONSTIPATION (ICD-564.00) Assessment Deteriorated  Orders: KUB for SITZ Marks (KUB for SITZ)  Problem # 2:  ESOPHAGEAL REFLUX (ICD-530.81) Assessment: Improved Continue reflux regime and daily AcipHex therapy.  Problem # 3:  HEMORRHAGE OF RECTUM AND ANUS (ICD-569.3) Assessment: Improved p.r.n. Analmantle cream locally.  Patient Instructions: 1)  Stop Calcium today and increase Amitiza to one tablet by mouth two times a day and start that after KUB x-ray in 5 days.  2)  We will contact you for the anal-rectal manometry. 3)  Go to [facility] radiology on 04-29-10 anytime to get your abdominal x-ray. 4)  Copy sent to : Joette Catching, MD 5)  The medication list was reviewed and reconciled.  All changed / newly prescribed medications were explained.  A complete medication list was provided to the patient / caregiver. Prescriptions: DULCOLAX 10 MG SUPP (BISACODYL) one suppository into rectum every 12 hours as needed  #15 x 1   Entered by:   Christie Nottingham CMA (AAMA)   Authorized by:   Mardella Layman MD [facility]   Signed by:   Mardella Layman MD [facility] on 04/25/2010   Method used:   Electronically to        [facility] [phone]* (retail)       [address]        [place], [place]  [address]       Ph: [phone] or [phone]       Fax: [phone]   RxID:   [id number]

## 2010-11-25 NOTE — Progress Notes (Signed)
Summary: test results  Phone Note Call from Patient Call back at Home Phone [phone]   Caller: Patient Call For: Dr Jarold Motto Reason for Call: Talk to Nurse Summary of Call: Patient wants test results she had done at [facility] Initial call taken by: Tawni Levy,  June 18, 2010 8:38 AM  Follow-up for Phone Call        [facility]. LM for nurse asking for results.  (phone number for [facility] clinic is [phone]) Follow-up by: Ashok Cordia RN,  June 18, 2010 9:46 AM  Additional Follow-up for Phone Call Additional follow up Details #1::        Pt informed of above. Ashok Cordia RN  June 18, 2010 10:11 AM    Additional Follow-up for Phone Call Additional follow up Details #2::    Received report form [facility].  Sent to Dr. Jarold Motto for review.  Pt notified. Follow-up by: Ashok Cordia RN,  June 19, 2010 3:14 PM

## 2010-11-25 NOTE — Miscellaneous (Signed)
Summary: OSP Consent Form/[facility]  OSP Consent Form/[facility]   Imported By: Sherian Rein 02/28/2010 11:41:12  _____________________________________________________________________  External Attachment:    Type:   Image     Comment:   External Document

## 2015-04-08 ENCOUNTER — Encounter: Payer: Self-pay | Admitting: Gastroenterology

## 2017-09-03 DIAGNOSIS — Z23 Encounter for immunization: Secondary | ICD-10-CM | POA: Diagnosis not present

## 2017-09-03 DIAGNOSIS — M7700 Medial epicondylitis, unspecified elbow: Secondary | ICD-10-CM | POA: Diagnosis not present

## 2017-10-22 DIAGNOSIS — Z1159 Encounter for screening for other viral diseases: Secondary | ICD-10-CM | POA: Diagnosis not present

## 2017-10-22 DIAGNOSIS — Z780 Asymptomatic menopausal state: Secondary | ICD-10-CM | POA: Diagnosis not present

## 2017-10-22 DIAGNOSIS — F17210 Nicotine dependence, cigarettes, uncomplicated: Secondary | ICD-10-CM | POA: Diagnosis not present

## 2017-10-22 DIAGNOSIS — Z0000 Encounter for general adult medical examination without abnormal findings: Secondary | ICD-10-CM | POA: Diagnosis not present

## 2017-10-22 DIAGNOSIS — K219 Gastro-esophageal reflux disease without esophagitis: Secondary | ICD-10-CM | POA: Diagnosis not present

## 2017-10-22 DIAGNOSIS — D508 Other iron deficiency anemias: Secondary | ICD-10-CM | POA: Diagnosis not present

## 2018-08-26 ENCOUNTER — Encounter (HOSPITAL_COMMUNITY): Payer: Self-pay | Admitting: Emergency Medicine

## 2018-08-26 ENCOUNTER — Emergency Department (HOSPITAL_COMMUNITY): Payer: No Typology Code available for payment source

## 2018-08-26 ENCOUNTER — Emergency Department (HOSPITAL_COMMUNITY)
Admission: EM | Admit: 2018-08-26 | Discharge: 2018-08-26 | Disposition: A | Discharge status: Home / Self Care | Payer: No Typology Code available for payment source | Attending: Emergency Medicine | Admitting: Emergency Medicine

## 2018-08-26 ENCOUNTER — Other Ambulatory Visit: Payer: Self-pay

## 2018-08-26 DIAGNOSIS — R51 Headache: Secondary | ICD-10-CM | POA: Insufficient documentation

## 2018-08-26 DIAGNOSIS — Y999 Unspecified external cause status: Secondary | ICD-10-CM | POA: Diagnosis not present

## 2018-08-26 DIAGNOSIS — R457 State of emotional shock and stress, unspecified: Secondary | ICD-10-CM | POA: Diagnosis not present

## 2018-08-26 DIAGNOSIS — M542 Cervicalgia: Secondary | ICD-10-CM | POA: Diagnosis not present

## 2018-08-26 DIAGNOSIS — F17200 Nicotine dependence, unspecified, uncomplicated: Secondary | ICD-10-CM | POA: Insufficient documentation

## 2018-08-26 DIAGNOSIS — Y92410 Unspecified street and highway as the place of occurrence of the external cause: Secondary | ICD-10-CM | POA: Insufficient documentation

## 2018-08-26 DIAGNOSIS — J449 Chronic obstructive pulmonary disease, unspecified: Secondary | ICD-10-CM | POA: Insufficient documentation

## 2018-08-26 DIAGNOSIS — S7002XA Contusion of left hip, initial encounter: Secondary | ICD-10-CM | POA: Diagnosis not present

## 2018-08-26 DIAGNOSIS — Y9389 Activity, other specified: Secondary | ICD-10-CM | POA: Diagnosis not present

## 2018-08-26 DIAGNOSIS — S79912A Unspecified injury of left hip, initial encounter: Secondary | ICD-10-CM | POA: Diagnosis present

## 2018-08-26 HISTORY — DX: Unspecified osteoarthritis, unspecified site: M19.90

## 2018-08-26 HISTORY — DX: Gastro-esophageal reflux disease without esophagitis: K21.9

## 2018-08-26 MED ORDER — HYDROCODONE-ACETAMINOPHEN 5-325 MG PO TABS: ORAL_TABLET | ORAL | 0 refills | Status: AC

## 2018-08-26 MED ORDER — METHOCARBAMOL 500 MG PO TABS: 500.0000 mg | ORAL_TABLET | Freq: Three times a day (TID) | ORAL | 0 refills | Status: AC
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
## 2018-08-26 NOTE — Discharge Instructions (Signed)
Apply ice packs on and off to your hip and neck.  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  I have provided the list for [place] [facility] for you.  Follow-up with your primary doctor for recheck return to the ER for any worsening symptoms.
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
## 2018-08-26 NOTE — ED Notes (Signed)
TT in to assess 

## 2018-09-05 DIAGNOSIS — M5020 Other cervical disc displacement, unspecified cervical region: Secondary | ICD-10-CM | POA: Diagnosis not present

## 2018-09-05 DIAGNOSIS — Z23 Encounter for immunization: Secondary | ICD-10-CM | POA: Diagnosis not present

## 2018-09-19 DIAGNOSIS — Z6825 Body mass index (BMI) 25.0-25.9, adult: Secondary | ICD-10-CM | POA: Diagnosis not present

## 2018-09-19 DIAGNOSIS — M542 Cervicalgia: Secondary | ICD-10-CM | POA: Diagnosis not present

## 2018-09-19 DIAGNOSIS — I10 Essential (primary) hypertension: Secondary | ICD-10-CM | POA: Diagnosis not present

## 2018-11-01 DIAGNOSIS — K219 Gastro-esophageal reflux disease without esophagitis: Secondary | ICD-10-CM | POA: Diagnosis not present

## 2018-11-01 DIAGNOSIS — Z1239 Encounter for other screening for malignant neoplasm of breast: Secondary | ICD-10-CM | POA: Diagnosis not present

## 2018-11-01 DIAGNOSIS — D508 Other iron deficiency anemias: Secondary | ICD-10-CM | POA: Diagnosis not present

## 2018-11-01 DIAGNOSIS — Z13820 Encounter for screening for osteoporosis: Secondary | ICD-10-CM | POA: Diagnosis not present

## 2018-11-01 DIAGNOSIS — Z0000 Encounter for general adult medical examination without abnormal findings: Secondary | ICD-10-CM | POA: Diagnosis not present

## 2018-11-01 DIAGNOSIS — Z8249 Family history of ischemic heart disease and other diseases of the circulatory system: Secondary | ICD-10-CM | POA: Diagnosis not present

## 2018-11-10 DIAGNOSIS — F17210 Nicotine dependence, cigarettes, uncomplicated: Secondary | ICD-10-CM | POA: Diagnosis not present

## 2018-11-10 DIAGNOSIS — Z716 Tobacco abuse counseling: Secondary | ICD-10-CM | POA: Diagnosis not present

## 2018-11-10 DIAGNOSIS — M8588 Other specified disorders of bone density and structure, other site: Secondary | ICD-10-CM | POA: Diagnosis not present

## 2018-11-10 DIAGNOSIS — Z13820 Encounter for screening for osteoporosis: Secondary | ICD-10-CM | POA: Diagnosis not present

## 2018-11-10 DIAGNOSIS — Z008 Encounter for other general examination: Secondary | ICD-10-CM | POA: Diagnosis not present

## 2018-11-10 DIAGNOSIS — Z87891 Personal history of nicotine dependence: Secondary | ICD-10-CM | POA: Diagnosis not present

## 2018-11-10 DIAGNOSIS — Z719 Counseling, unspecified: Secondary | ICD-10-CM | POA: Diagnosis not present

## 2018-11-10 DIAGNOSIS — M810 Age-related osteoporosis without current pathological fracture: Secondary | ICD-10-CM | POA: Diagnosis not present

## 2018-11-10 DIAGNOSIS — Z1231 Encounter for screening mammogram for malignant neoplasm of breast: Secondary | ICD-10-CM | POA: Diagnosis not present

## 2018-11-23 DIAGNOSIS — R200 Anesthesia of skin: Secondary | ICD-10-CM | POA: Diagnosis not present

## 2018-11-23 DIAGNOSIS — M4712 Other spondylosis with myelopathy, cervical region: Secondary | ICD-10-CM | POA: Diagnosis not present

## 2018-11-23 DIAGNOSIS — M50221 Other cervical disc displacement at C4-C5 level: Secondary | ICD-10-CM | POA: Diagnosis not present

## 2018-11-23 DIAGNOSIS — M5030 Other cervical disc degeneration, unspecified cervical region: Secondary | ICD-10-CM | POA: Diagnosis not present

## 2018-11-29 DIAGNOSIS — M47812 Spondylosis without myelopathy or radiculopathy, cervical region: Secondary | ICD-10-CM | POA: Diagnosis not present

## 2018-11-29 DIAGNOSIS — G959 Disease of spinal cord, unspecified: Secondary | ICD-10-CM | POA: Diagnosis not present

## 2018-11-29 DIAGNOSIS — M5021 Other cervical disc displacement,  high cervical region: Secondary | ICD-10-CM | POA: Diagnosis not present

## 2018-11-29 DIAGNOSIS — M4802 Spinal stenosis, cervical region: Secondary | ICD-10-CM | POA: Diagnosis not present

## 2018-11-29 DIAGNOSIS — M50221 Other cervical disc displacement at C4-C5 level: Secondary | ICD-10-CM | POA: Diagnosis not present

## 2018-11-30 DIAGNOSIS — M5020 Other cervical disc displacement, unspecified cervical region: Secondary | ICD-10-CM | POA: Diagnosis not present

## 2018-11-30 DIAGNOSIS — M5030 Other cervical disc degeneration, unspecified cervical region: Secondary | ICD-10-CM | POA: Diagnosis not present

## 2018-11-30 DIAGNOSIS — R200 Anesthesia of skin: Secondary | ICD-10-CM | POA: Diagnosis not present

## 2018-11-30 DIAGNOSIS — M4712 Other spondylosis with myelopathy, cervical region: Secondary | ICD-10-CM | POA: Diagnosis not present

## 2018-12-01 DIAGNOSIS — F17210 Nicotine dependence, cigarettes, uncomplicated: Secondary | ICD-10-CM | POA: Diagnosis not present

## 2018-12-01 DIAGNOSIS — Z716 Tobacco abuse counseling: Secondary | ICD-10-CM | POA: Diagnosis not present

## 2018-12-19 DIAGNOSIS — Z716 Tobacco abuse counseling: Secondary | ICD-10-CM | POA: Diagnosis not present

## 2018-12-19 DIAGNOSIS — F17210 Nicotine dependence, cigarettes, uncomplicated: Secondary | ICD-10-CM | POA: Diagnosis not present

## 2018-12-26 DIAGNOSIS — M47812 Spondylosis without myelopathy or radiculopathy, cervical region: Secondary | ICD-10-CM | POA: Diagnosis not present

## 2018-12-26 DIAGNOSIS — M5030 Other cervical disc degeneration, unspecified cervical region: Secondary | ICD-10-CM | POA: Diagnosis not present

## 2018-12-26 DIAGNOSIS — M9981 Other biomechanical lesions of cervical region: Secondary | ICD-10-CM | POA: Diagnosis not present

## 2019-01-02 DIAGNOSIS — F17210 Nicotine dependence, cigarettes, uncomplicated: Secondary | ICD-10-CM | POA: Diagnosis not present

## 2019-01-02 DIAGNOSIS — Z716 Tobacco abuse counseling: Secondary | ICD-10-CM | POA: Diagnosis not present

## 2019-01-20 DIAGNOSIS — M5020 Other cervical disc displacement, unspecified cervical region: Secondary | ICD-10-CM | POA: Diagnosis not present

## 2019-01-20 DIAGNOSIS — M9981 Other biomechanical lesions of cervical region: Secondary | ICD-10-CM | POA: Diagnosis not present

## 2019-01-20 DIAGNOSIS — M47812 Spondylosis without myelopathy or radiculopathy, cervical region: Secondary | ICD-10-CM | POA: Diagnosis not present

## 2019-01-20 DIAGNOSIS — M4712 Other spondylosis with myelopathy, cervical region: Secondary | ICD-10-CM | POA: Diagnosis not present

## 2019-01-27 DIAGNOSIS — M4712 Other spondylosis with myelopathy, cervical region: Secondary | ICD-10-CM | POA: Diagnosis not present

## 2019-01-27 DIAGNOSIS — M5030 Other cervical disc degeneration, unspecified cervical region: Secondary | ICD-10-CM | POA: Diagnosis not present

## 2019-01-27 DIAGNOSIS — M47812 Spondylosis without myelopathy or radiculopathy, cervical region: Secondary | ICD-10-CM | POA: Diagnosis not present

## 2019-01-27 DIAGNOSIS — M7918 Myalgia, other site: Secondary | ICD-10-CM | POA: Diagnosis not present

## 2019-03-03 DIAGNOSIS — M47812 Spondylosis without myelopathy or radiculopathy, cervical region: Secondary | ICD-10-CM | POA: Diagnosis not present

## 2019-03-03 DIAGNOSIS — M5412 Radiculopathy, cervical region: Secondary | ICD-10-CM | POA: Diagnosis not present

## 2019-03-03 DIAGNOSIS — M5030 Other cervical disc degeneration, unspecified cervical region: Secondary | ICD-10-CM | POA: Diagnosis not present

## 2019-03-03 DIAGNOSIS — M50023 Cervical disc disorder at C6-C7 level with myelopathy: Secondary | ICD-10-CM | POA: Diagnosis not present

## 2019-03-03 DIAGNOSIS — M4316 Spondylolisthesis, lumbar region: Secondary | ICD-10-CM | POA: Diagnosis not present

## 2019-04-20 DIAGNOSIS — Z716 Tobacco abuse counseling: Secondary | ICD-10-CM | POA: Diagnosis not present

## 2019-04-20 DIAGNOSIS — F17210 Nicotine dependence, cigarettes, uncomplicated: Secondary | ICD-10-CM | POA: Diagnosis not present

## 2019-05-02 DIAGNOSIS — Z720 Tobacco use: Secondary | ICD-10-CM | POA: Diagnosis not present

## 2019-05-02 DIAGNOSIS — D649 Anemia, unspecified: Secondary | ICD-10-CM | POA: Diagnosis not present

## 2019-05-02 DIAGNOSIS — K219 Gastro-esophageal reflux disease without esophagitis: Secondary | ICD-10-CM | POA: Diagnosis not present

## 2019-05-02 DIAGNOSIS — Z23 Encounter for immunization: Secondary | ICD-10-CM | POA: Diagnosis not present

## 2019-05-09 DIAGNOSIS — F17210 Nicotine dependence, cigarettes, uncomplicated: Secondary | ICD-10-CM | POA: Diagnosis not present

## 2019-05-17 DIAGNOSIS — Z719 Counseling, unspecified: Secondary | ICD-10-CM | POA: Diagnosis not present

## 2019-06-21 DIAGNOSIS — F17210 Nicotine dependence, cigarettes, uncomplicated: Secondary | ICD-10-CM | POA: Diagnosis not present

## 2019-06-21 DIAGNOSIS — Z716 Tobacco abuse counseling: Secondary | ICD-10-CM | POA: Diagnosis not present

## 2019-07-26 DIAGNOSIS — F17210 Nicotine dependence, cigarettes, uncomplicated: Secondary | ICD-10-CM | POA: Diagnosis not present

## 2019-07-26 DIAGNOSIS — Z716 Tobacco abuse counseling: Secondary | ICD-10-CM | POA: Diagnosis not present

## 2019-07-28 DIAGNOSIS — D649 Anemia, unspecified: Secondary | ICD-10-CM | POA: Diagnosis not present

## 2019-07-28 DIAGNOSIS — E559 Vitamin D deficiency, unspecified: Secondary | ICD-10-CM | POA: Diagnosis not present

## 2019-07-28 DIAGNOSIS — K219 Gastro-esophageal reflux disease without esophagitis: Secondary | ICD-10-CM | POA: Diagnosis not present

## 2019-07-31 DIAGNOSIS — Z720 Tobacco use: Secondary | ICD-10-CM | POA: Diagnosis not present

## 2019-07-31 DIAGNOSIS — R918 Other nonspecific abnormal finding of lung field: Secondary | ICD-10-CM | POA: Diagnosis not present

## 2019-07-31 DIAGNOSIS — Z23 Encounter for immunization: Secondary | ICD-10-CM | POA: Diagnosis not present

## 2019-07-31 DIAGNOSIS — I700 Atherosclerosis of aorta: Secondary | ICD-10-CM | POA: Diagnosis not present

## 2019-07-31 DIAGNOSIS — J439 Emphysema, unspecified: Secondary | ICD-10-CM | POA: Diagnosis not present
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
## 2019-08-28 DIAGNOSIS — F17210 Nicotine dependence, cigarettes, uncomplicated: Secondary | ICD-10-CM | POA: Diagnosis not present

## 2019-08-28 DIAGNOSIS — Z716 Tobacco abuse counseling: Secondary | ICD-10-CM | POA: Diagnosis not present

## 2019-11-06 DIAGNOSIS — R911 Solitary pulmonary nodule: Secondary | ICD-10-CM | POA: Diagnosis not present

## 2019-11-06 DIAGNOSIS — I700 Atherosclerosis of aorta: Secondary | ICD-10-CM | POA: Diagnosis not present

## 2019-11-06 DIAGNOSIS — R918 Other nonspecific abnormal finding of lung field: Secondary | ICD-10-CM | POA: Diagnosis not present

## 2020-01-24 DIAGNOSIS — R200 Anesthesia of skin: Secondary | ICD-10-CM | POA: Diagnosis not present

## 2020-01-24 DIAGNOSIS — R079 Chest pain, unspecified: Secondary | ICD-10-CM | POA: Diagnosis not present

## 2020-01-24 DIAGNOSIS — Z885 Allergy status to narcotic agent status: Secondary | ICD-10-CM | POA: Diagnosis not present

## 2020-01-24 DIAGNOSIS — F17200 Nicotine dependence, unspecified, uncomplicated: Secondary | ICD-10-CM | POA: Diagnosis not present

## 2020-01-24 DIAGNOSIS — J449 Chronic obstructive pulmonary disease, unspecified: Secondary | ICD-10-CM | POA: Diagnosis not present

## 2020-01-24 DIAGNOSIS — Z79899 Other long term (current) drug therapy: Secondary | ICD-10-CM | POA: Diagnosis not present

## 2020-01-24 DIAGNOSIS — K219 Gastro-esophageal reflux disease without esophagitis: Secondary | ICD-10-CM | POA: Diagnosis not present

## 2020-01-25 DIAGNOSIS — Z720 Tobacco use: Secondary | ICD-10-CM | POA: Diagnosis not present

## 2020-01-25 DIAGNOSIS — R079 Chest pain, unspecified: Secondary | ICD-10-CM | POA: Diagnosis not present

## 2020-01-25 DIAGNOSIS — Z6826 Body mass index (BMI) 26.0-26.9, adult: Secondary | ICD-10-CM | POA: Diagnosis not present

## 2020-01-25 DIAGNOSIS — I700 Atherosclerosis of aorta: Secondary | ICD-10-CM | POA: Diagnosis not present

## 2020-02-09 DIAGNOSIS — Z0001 Encounter for general adult medical examination with abnormal findings: Secondary | ICD-10-CM | POA: Diagnosis not present

## 2020-02-14 DIAGNOSIS — Z0001 Encounter for general adult medical examination with abnormal findings: Secondary | ICD-10-CM | POA: Diagnosis not present

## 2020-02-14 DIAGNOSIS — Z23 Encounter for immunization: Secondary | ICD-10-CM | POA: Diagnosis not present

## 2020-02-14 DIAGNOSIS — Z720 Tobacco use: Secondary | ICD-10-CM | POA: Diagnosis not present

## 2020-02-15 DIAGNOSIS — I700 Atherosclerosis of aorta: Secondary | ICD-10-CM | POA: Diagnosis not present

## 2020-02-15 DIAGNOSIS — I208 Other forms of angina pectoris: Secondary | ICD-10-CM | POA: Diagnosis not present

## 2020-02-15 DIAGNOSIS — R002 Palpitations: Secondary | ICD-10-CM | POA: Diagnosis not present

## 2020-02-15 DIAGNOSIS — Z720 Tobacco use: Secondary | ICD-10-CM | POA: Diagnosis not present

## 2020-02-26 DIAGNOSIS — R002 Palpitations: Secondary | ICD-10-CM | POA: Diagnosis not present

## 2020-02-26 DIAGNOSIS — I209 Angina pectoris, unspecified: Secondary | ICD-10-CM | POA: Diagnosis not present

## 2020-03-01 DIAGNOSIS — R002 Palpitations: Secondary | ICD-10-CM | POA: Diagnosis not present

## 2020-03-01 DIAGNOSIS — I209 Angina pectoris, unspecified: Secondary | ICD-10-CM | POA: Diagnosis not present

## 2020-03-01 DIAGNOSIS — R079 Chest pain, unspecified: Secondary | ICD-10-CM | POA: Diagnosis not present

## 2020-03-04 DIAGNOSIS — I209 Angina pectoris, unspecified: Secondary | ICD-10-CM | POA: Diagnosis not present

## 2020-03-04 DIAGNOSIS — R002 Palpitations: Secondary | ICD-10-CM | POA: Diagnosis not present

## 2020-03-06 DIAGNOSIS — R002 Palpitations: Secondary | ICD-10-CM | POA: Diagnosis not present

## 2020-03-14 DIAGNOSIS — I700 Atherosclerosis of aorta: Secondary | ICD-10-CM | POA: Diagnosis not present

## 2020-03-14 DIAGNOSIS — E785 Hyperlipidemia, unspecified: Secondary | ICD-10-CM | POA: Diagnosis not present

## 2020-03-14 DIAGNOSIS — Z720 Tobacco use: Secondary | ICD-10-CM | POA: Diagnosis not present

## 2020-03-14 DIAGNOSIS — R079 Chest pain, unspecified: Secondary | ICD-10-CM | POA: Diagnosis not present

## 2022-08-29 DIAGNOSIS — E559 Vitamin D deficiency, unspecified: Secondary | ICD-10-CM | POA: Diagnosis not present

## 2022-08-29 DIAGNOSIS — Z0001 Encounter for general adult medical examination with abnormal findings: Secondary | ICD-10-CM | POA: Diagnosis not present

## 2022-08-29 DIAGNOSIS — D649 Anemia, unspecified: Secondary | ICD-10-CM | POA: Diagnosis not present

## 2022-09-02 DIAGNOSIS — D649 Anemia, unspecified: Secondary | ICD-10-CM | POA: Diagnosis not present

## 2022-09-02 DIAGNOSIS — Z6826 Body mass index (BMI) 26.0-26.9, adult: Secondary | ICD-10-CM | POA: Diagnosis not present

## 2022-09-02 DIAGNOSIS — Z0001 Encounter for general adult medical examination with abnormal findings: Secondary | ICD-10-CM | POA: Diagnosis not present

## 2022-09-02 DIAGNOSIS — Z720 Tobacco use: Secondary | ICD-10-CM | POA: Diagnosis not present

## 2022-09-02 DIAGNOSIS — J439 Emphysema, unspecified: Secondary | ICD-10-CM | POA: Diagnosis not present

## 2022-09-02 DIAGNOSIS — R918 Other nonspecific abnormal finding of lung field: Secondary | ICD-10-CM | POA: Diagnosis not present

## 2022-09-02 DIAGNOSIS — K219 Gastro-esophageal reflux disease without esophagitis: Secondary | ICD-10-CM | POA: Diagnosis not present

## 2022-09-02 DIAGNOSIS — I700 Atherosclerosis of aorta: Secondary | ICD-10-CM | POA: Diagnosis not present

## 2022-09-02 DIAGNOSIS — F1510 Other stimulant abuse, uncomplicated: Secondary | ICD-10-CM | POA: Diagnosis not present

## 2022-09-08 DIAGNOSIS — Z1231 Encounter for screening mammogram for malignant neoplasm of breast: Secondary | ICD-10-CM | POA: Diagnosis not present

## 2022-09-10 DIAGNOSIS — Z87891 Personal history of nicotine dependence: Secondary | ICD-10-CM | POA: Diagnosis not present

## 2022-09-10 DIAGNOSIS — Z122 Encounter for screening for malignant neoplasm of respiratory organs: Secondary | ICD-10-CM | POA: Diagnosis not present

## 2022-09-10 DIAGNOSIS — F17210 Nicotine dependence, cigarettes, uncomplicated: Secondary | ICD-10-CM | POA: Diagnosis not present

## 2022-09-29 DIAGNOSIS — Z1211 Encounter for screening for malignant neoplasm of colon: Secondary | ICD-10-CM | POA: Diagnosis not present

## 2022-09-29 DIAGNOSIS — R58 Hemorrhage, not elsewhere classified: Secondary | ICD-10-CM | POA: Diagnosis not present

## 2022-09-29 DIAGNOSIS — Z86010 Personal history of colonic polyps: Secondary | ICD-10-CM | POA: Diagnosis not present

## 2022-11-06 DIAGNOSIS — K648 Other hemorrhoids: Secondary | ICD-10-CM | POA: Diagnosis not present

## 2022-11-06 DIAGNOSIS — D120 Benign neoplasm of cecum: Secondary | ICD-10-CM | POA: Diagnosis not present

## 2022-11-06 DIAGNOSIS — K635 Polyp of colon: Secondary | ICD-10-CM | POA: Diagnosis not present

## 2022-11-06 DIAGNOSIS — Z86010 Personal history of colonic polyps: Secondary | ICD-10-CM | POA: Diagnosis not present

## 2022-11-06 DIAGNOSIS — Z885 Allergy status to narcotic agent status: Secondary | ICD-10-CM | POA: Diagnosis not present

## 2022-11-06 DIAGNOSIS — K5730 Diverticulosis of large intestine without perforation or abscess without bleeding: Secondary | ICD-10-CM | POA: Diagnosis not present

## 2022-11-06 DIAGNOSIS — Z7982 Long term (current) use of aspirin: Secondary | ICD-10-CM | POA: Diagnosis not present

## 2022-11-06 DIAGNOSIS — F17210 Nicotine dependence, cigarettes, uncomplicated: Secondary | ICD-10-CM | POA: Diagnosis not present

## 2022-11-06 DIAGNOSIS — K621 Rectal polyp: Secondary | ICD-10-CM | POA: Diagnosis not present

## 2022-11-06 DIAGNOSIS — Z1211 Encounter for screening for malignant neoplasm of colon: Secondary | ICD-10-CM | POA: Diagnosis not present

## 2022-11-06 DIAGNOSIS — K644 Residual hemorrhoidal skin tags: Secondary | ICD-10-CM | POA: Diagnosis not present

## 2022-11-19 DIAGNOSIS — D120 Benign neoplasm of cecum: Secondary | ICD-10-CM | POA: Diagnosis not present

## 2022-12-02 DIAGNOSIS — D649 Anemia, unspecified: Secondary | ICD-10-CM | POA: Diagnosis not present

## 2022-12-02 DIAGNOSIS — K219 Gastro-esophageal reflux disease without esophagitis: Secondary | ICD-10-CM | POA: Diagnosis not present

## 2022-12-07 DIAGNOSIS — E278 Other specified disorders of adrenal gland: Secondary | ICD-10-CM | POA: Diagnosis not present

## 2022-12-07 DIAGNOSIS — S20211A Contusion of right front wall of thorax, initial encounter: Secondary | ICD-10-CM | POA: Diagnosis not present

## 2022-12-07 DIAGNOSIS — J439 Emphysema, unspecified: Secondary | ICD-10-CM | POA: Diagnosis not present

## 2022-12-07 DIAGNOSIS — F1510 Other stimulant abuse, uncomplicated: Secondary | ICD-10-CM | POA: Diagnosis not present

## 2022-12-07 DIAGNOSIS — Z720 Tobacco use: Secondary | ICD-10-CM | POA: Diagnosis not present

## 2022-12-07 DIAGNOSIS — R0781 Pleurodynia: Secondary | ICD-10-CM | POA: Diagnosis not present

## 2022-12-07 DIAGNOSIS — R918 Other nonspecific abnormal finding of lung field: Secondary | ICD-10-CM | POA: Diagnosis not present

## 2022-12-07 DIAGNOSIS — D126 Benign neoplasm of colon, unspecified: Secondary | ICD-10-CM | POA: Diagnosis not present

## 2022-12-07 DIAGNOSIS — T148XXA Other injury of unspecified body region, initial encounter: Secondary | ICD-10-CM | POA: Diagnosis not present

## 2022-12-07 DIAGNOSIS — I700 Atherosclerosis of aorta: Secondary | ICD-10-CM | POA: Diagnosis not present

## 2022-12-07 DIAGNOSIS — K219 Gastro-esophageal reflux disease without esophagitis: Secondary | ICD-10-CM | POA: Diagnosis not present

## 2022-12-25 DIAGNOSIS — H269 Unspecified cataract: Secondary | ICD-10-CM | POA: Diagnosis not present

## 2022-12-25 DIAGNOSIS — H2512 Age-related nuclear cataract, left eye: Secondary | ICD-10-CM | POA: Diagnosis not present

## 2023-01-08 DIAGNOSIS — H269 Unspecified cataract: Secondary | ICD-10-CM | POA: Diagnosis not present

## 2023-01-08 DIAGNOSIS — H2511 Age-related nuclear cataract, right eye: Secondary | ICD-10-CM | POA: Diagnosis not present

## 2023-08-30 DIAGNOSIS — Z0001 Encounter for general adult medical examination with abnormal findings: Secondary | ICD-10-CM | POA: Diagnosis not present

## 2023-08-30 DIAGNOSIS — Z720 Tobacco use: Secondary | ICD-10-CM | POA: Diagnosis not present

## 2023-08-30 DIAGNOSIS — E039 Hypothyroidism, unspecified: Secondary | ICD-10-CM | POA: Diagnosis not present

## 2023-08-30 DIAGNOSIS — J439 Emphysema, unspecified: Secondary | ICD-10-CM | POA: Diagnosis not present

## 2023-08-30 DIAGNOSIS — Z1321 Encounter for screening for nutritional disorder: Secondary | ICD-10-CM | POA: Diagnosis not present

## 2023-08-30 DIAGNOSIS — Z1329 Encounter for screening for other suspected endocrine disorder: Secondary | ICD-10-CM | POA: Diagnosis not present

## 2023-08-30 DIAGNOSIS — R918 Other nonspecific abnormal finding of lung field: Secondary | ICD-10-CM | POA: Diagnosis not present

## 2023-08-30 DIAGNOSIS — E559 Vitamin D deficiency, unspecified: Secondary | ICD-10-CM | POA: Diagnosis not present

## 2023-09-06 DIAGNOSIS — Z0001 Encounter for general adult medical examination with abnormal findings: Secondary | ICD-10-CM | POA: Diagnosis not present

## 2023-09-06 DIAGNOSIS — K219 Gastro-esophageal reflux disease without esophagitis: Secondary | ICD-10-CM | POA: Diagnosis not present

## 2023-09-06 DIAGNOSIS — J439 Emphysema, unspecified: Secondary | ICD-10-CM | POA: Diagnosis not present

## 2023-09-06 DIAGNOSIS — E278 Other specified disorders of adrenal gland: Secondary | ICD-10-CM | POA: Diagnosis not present

## 2023-09-06 DIAGNOSIS — F1510 Other stimulant abuse, uncomplicated: Secondary | ICD-10-CM | POA: Diagnosis not present

## 2023-09-06 DIAGNOSIS — R918 Other nonspecific abnormal finding of lung field: Secondary | ICD-10-CM | POA: Diagnosis not present

## 2023-09-06 DIAGNOSIS — D126 Benign neoplasm of colon, unspecified: Secondary | ICD-10-CM | POA: Diagnosis not present

## 2023-09-06 DIAGNOSIS — Z720 Tobacco use: Secondary | ICD-10-CM | POA: Diagnosis not present

## 2023-09-16 DIAGNOSIS — Z122 Encounter for screening for malignant neoplasm of respiratory organs: Secondary | ICD-10-CM | POA: Diagnosis not present

## 2023-09-16 DIAGNOSIS — F17210 Nicotine dependence, cigarettes, uncomplicated: Secondary | ICD-10-CM | POA: Diagnosis not present

## 2023-11-17 ENCOUNTER — Other Ambulatory Visit: Payer: Self-pay | Admitting: Family Medicine

## 2023-11-17 DIAGNOSIS — R921 Mammographic calcification found on diagnostic imaging of breast: Secondary | ICD-10-CM

## 2023-11-26 ENCOUNTER — Ambulatory Visit
Admission: RE | Admit: 2023-11-26 | Discharge: 2023-11-26 | Disposition: A | Discharge status: Home / Self Care | Payer: Medicare Other | Source: Ambulatory Visit | Attending: Family Medicine | Admitting: Family Medicine

## 2023-11-26 DIAGNOSIS — R921 Mammographic calcification found on diagnostic imaging of breast: Secondary | ICD-10-CM

## 2023-11-26 HISTORY — PX: BREAST BIOPSY: SHX20

## 2023-11-29 LAB — SURGICAL PATHOLOGY
